# Patient Record
Sex: Female | Born: 1975 | Race: White | Hispanic: No | Smoking: Current every day smoker
Health system: Southern US, Community
[De-identification: ages and names within clinical notes are randomized; demographics above are authoritative.]

## PROBLEM LIST (undated history)

## (undated) DIAGNOSIS — J449 Chronic obstructive pulmonary disease, unspecified: Secondary | ICD-10-CM

## (undated) DIAGNOSIS — J45909 Unspecified asthma, uncomplicated: Secondary | ICD-10-CM

## (undated) DIAGNOSIS — K5792 Diverticulitis of intestine, part unspecified, without perforation or abscess without bleeding: Secondary | ICD-10-CM

## (undated) HISTORY — PX: ABDOMINAL SURGERY: SHX537

## (undated) HISTORY — PX: TUBAL LIGATION: SHX77

---

## 1998-02-14 ENCOUNTER — Emergency Department (HOSPITAL_COMMUNITY): Admission: EM | Admit: 1998-02-14 | Discharge: 1998-02-14 | Payer: Self-pay | Admitting: Emergency Medicine

## 1998-03-01 ENCOUNTER — Encounter: Admission: RE | Admit: 1998-03-01 | Discharge: 1998-03-01 | Payer: Self-pay | Admitting: Family Medicine

## 1998-05-16 ENCOUNTER — Encounter: Payer: Self-pay | Admitting: Emergency Medicine

## 1998-05-16 ENCOUNTER — Emergency Department (HOSPITAL_COMMUNITY): Admission: EM | Admit: 1998-05-16 | Discharge: 1998-05-16 | Payer: Self-pay | Admitting: Emergency Medicine

## 1999-07-11 ENCOUNTER — Emergency Department (HOSPITAL_COMMUNITY): Admission: EM | Admit: 1999-07-11 | Discharge: 1999-07-11 | Payer: Self-pay | Admitting: Emergency Medicine

## 1999-07-13 ENCOUNTER — Emergency Department (HOSPITAL_COMMUNITY): Admission: EM | Admit: 1999-07-13 | Discharge: 1999-07-13 | Payer: Self-pay

## 1999-09-29 ENCOUNTER — Encounter (INDEPENDENT_AMBULATORY_CARE_PROVIDER_SITE_OTHER): Payer: Self-pay | Admitting: *Deleted

## 1999-09-29 LAB — CONVERTED CEMR LAB

## 1999-10-09 ENCOUNTER — Other Ambulatory Visit: Admission: RE | Admit: 1999-10-09 | Discharge: 1999-10-09 | Payer: Self-pay | Admitting: *Deleted

## 1999-10-24 ENCOUNTER — Ambulatory Visit (HOSPITAL_COMMUNITY): Admission: RE | Admit: 1999-10-24 | Discharge: 1999-10-24 | Payer: Self-pay | Admitting: *Deleted

## 1999-11-07 ENCOUNTER — Encounter: Admission: RE | Admit: 1999-11-07 | Discharge: 1999-11-07 | Payer: Self-pay | Admitting: Family Medicine

## 1999-11-11 ENCOUNTER — Encounter: Admission: RE | Admit: 1999-11-11 | Discharge: 1999-11-11 | Payer: Self-pay | Admitting: Family Medicine

## 1999-11-18 ENCOUNTER — Encounter: Payer: Self-pay | Admitting: Emergency Medicine

## 1999-11-18 ENCOUNTER — Emergency Department (HOSPITAL_COMMUNITY): Admission: EM | Admit: 1999-11-18 | Discharge: 1999-11-18 | Payer: Self-pay | Admitting: Emergency Medicine

## 1999-11-26 ENCOUNTER — Emergency Department (HOSPITAL_COMMUNITY): Admission: EM | Admit: 1999-11-26 | Discharge: 1999-11-26 | Payer: Self-pay | Admitting: Emergency Medicine

## 1999-12-14 ENCOUNTER — Encounter: Payer: Self-pay | Admitting: Orthopedic Surgery

## 1999-12-14 ENCOUNTER — Inpatient Hospital Stay (HOSPITAL_COMMUNITY): Admission: EM | Admit: 1999-12-14 | Discharge: 1999-12-15 | Payer: Self-pay | Admitting: Emergency Medicine

## 1999-12-14 ENCOUNTER — Encounter: Payer: Self-pay | Admitting: Emergency Medicine

## 1999-12-21 ENCOUNTER — Encounter: Payer: Self-pay | Admitting: Emergency Medicine

## 1999-12-21 ENCOUNTER — Emergency Department (HOSPITAL_COMMUNITY): Admission: EM | Admit: 1999-12-21 | Discharge: 1999-12-21 | Payer: Self-pay | Admitting: Emergency Medicine

## 2000-01-03 ENCOUNTER — Emergency Department (HOSPITAL_COMMUNITY): Admission: EM | Admit: 2000-01-03 | Discharge: 2000-01-03 | Payer: Self-pay | Admitting: Emergency Medicine

## 2000-01-22 ENCOUNTER — Ambulatory Visit (HOSPITAL_BASED_OUTPATIENT_CLINIC_OR_DEPARTMENT_OTHER): Admission: RE | Admit: 2000-01-22 | Discharge: 2000-01-22 | Payer: Self-pay | Admitting: Orthopedic Surgery

## 2000-01-24 ENCOUNTER — Encounter: Admission: RE | Admit: 2000-01-24 | Discharge: 2000-01-24 | Payer: Self-pay | Admitting: Family Medicine

## 2000-02-10 ENCOUNTER — Encounter: Admission: RE | Admit: 2000-02-10 | Discharge: 2000-02-10 | Payer: Self-pay | Admitting: Family Medicine

## 2000-02-12 ENCOUNTER — Ambulatory Visit (HOSPITAL_COMMUNITY): Admission: RE | Admit: 2000-02-12 | Discharge: 2000-02-12 | Payer: Self-pay | Admitting: Orthopedic Surgery

## 2000-03-03 ENCOUNTER — Ambulatory Visit (HOSPITAL_COMMUNITY): Admission: RE | Admit: 2000-03-03 | Discharge: 2000-03-03 | Payer: Self-pay | Admitting: Orthopedic Surgery

## 2000-03-03 ENCOUNTER — Encounter: Payer: Self-pay | Admitting: Orthopedic Surgery

## 2000-03-23 ENCOUNTER — Encounter: Admission: RE | Admit: 2000-03-23 | Discharge: 2000-06-21 | Payer: Self-pay | Admitting: Orthopedic Surgery

## 2000-11-08 ENCOUNTER — Emergency Department (HOSPITAL_COMMUNITY): Admission: EM | Admit: 2000-11-08 | Discharge: 2000-11-08 | Payer: Self-pay | Admitting: Emergency Medicine

## 2001-01-21 ENCOUNTER — Emergency Department (HOSPITAL_COMMUNITY): Admission: EM | Admit: 2001-01-21 | Discharge: 2001-01-21 | Payer: Self-pay | Admitting: Emergency Medicine

## 2001-04-16 ENCOUNTER — Emergency Department (HOSPITAL_COMMUNITY): Admission: EM | Admit: 2001-04-16 | Discharge: 2001-04-16 | Payer: Self-pay | Admitting: Emergency Medicine

## 2001-05-20 ENCOUNTER — Emergency Department (HOSPITAL_COMMUNITY): Admission: EM | Admit: 2001-05-20 | Discharge: 2001-05-20 | Payer: Self-pay | Admitting: Emergency Medicine

## 2001-09-17 ENCOUNTER — Emergency Department (HOSPITAL_COMMUNITY): Admission: EM | Admit: 2001-09-17 | Discharge: 2001-09-17 | Payer: Self-pay

## 2001-10-22 ENCOUNTER — Emergency Department (HOSPITAL_COMMUNITY): Admission: EM | Admit: 2001-10-22 | Discharge: 2001-10-22 | Payer: Self-pay

## 2001-10-24 ENCOUNTER — Emergency Department (HOSPITAL_COMMUNITY): Admission: EM | Admit: 2001-10-24 | Discharge: 2001-10-25 | Payer: Self-pay | Admitting: *Deleted

## 2001-12-02 ENCOUNTER — Encounter: Admission: RE | Admit: 2001-12-02 | Discharge: 2001-12-02 | Payer: Self-pay | Admitting: Internal Medicine

## 2002-05-14 ENCOUNTER — Emergency Department (HOSPITAL_COMMUNITY): Admission: EM | Admit: 2002-05-14 | Discharge: 2002-05-14 | Payer: Self-pay | Admitting: Emergency Medicine

## 2002-06-15 ENCOUNTER — Emergency Department (HOSPITAL_COMMUNITY): Admission: EM | Admit: 2002-06-15 | Discharge: 2002-06-15 | Payer: Self-pay | Admitting: Emergency Medicine

## 2002-06-15 ENCOUNTER — Encounter: Payer: Self-pay | Admitting: Emergency Medicine

## 2002-09-06 ENCOUNTER — Emergency Department (HOSPITAL_COMMUNITY): Admission: EM | Admit: 2002-09-06 | Discharge: 2002-09-06 | Payer: Self-pay | Admitting: Emergency Medicine

## 2002-09-06 ENCOUNTER — Encounter: Payer: Self-pay | Admitting: Emergency Medicine

## 2002-09-08 ENCOUNTER — Emergency Department (HOSPITAL_COMMUNITY): Admission: EM | Admit: 2002-09-08 | Discharge: 2002-09-09 | Payer: Self-pay | Admitting: Emergency Medicine

## 2002-11-01 ENCOUNTER — Encounter: Admission: RE | Admit: 2002-11-01 | Discharge: 2002-11-01 | Payer: Self-pay | Admitting: Sports Medicine

## 2002-11-09 ENCOUNTER — Emergency Department (HOSPITAL_COMMUNITY): Admission: EM | Admit: 2002-11-09 | Discharge: 2002-11-09 | Payer: Self-pay | Admitting: Emergency Medicine

## 2002-12-09 ENCOUNTER — Encounter: Admission: RE | Admit: 2002-12-09 | Discharge: 2002-12-09 | Payer: Self-pay | Admitting: Family Medicine

## 2002-12-28 ENCOUNTER — Emergency Department (HOSPITAL_COMMUNITY): Admission: EM | Admit: 2002-12-28 | Discharge: 2002-12-28 | Payer: Self-pay | Admitting: Emergency Medicine

## 2002-12-28 ENCOUNTER — Encounter: Payer: Self-pay | Admitting: *Deleted

## 2003-01-16 ENCOUNTER — Encounter: Admission: RE | Admit: 2003-01-16 | Discharge: 2003-01-16 | Payer: Self-pay | Admitting: Family Medicine

## 2003-08-18 ENCOUNTER — Other Ambulatory Visit: Payer: Self-pay

## 2003-08-21 ENCOUNTER — Other Ambulatory Visit: Payer: Self-pay

## 2003-10-26 ENCOUNTER — Emergency Department (HOSPITAL_COMMUNITY): Admission: EM | Admit: 2003-10-26 | Discharge: 2003-10-26 | Payer: Self-pay | Admitting: Family Medicine

## 2003-10-28 ENCOUNTER — Emergency Department (HOSPITAL_COMMUNITY): Admission: EM | Admit: 2003-10-28 | Discharge: 2003-10-28 | Payer: Self-pay | Admitting: Emergency Medicine

## 2003-11-24 ENCOUNTER — Emergency Department (HOSPITAL_COMMUNITY): Admission: EM | Admit: 2003-11-24 | Discharge: 2003-11-24 | Payer: Self-pay | Admitting: Emergency Medicine

## 2004-01-17 ENCOUNTER — Emergency Department (HOSPITAL_COMMUNITY): Admission: EM | Admit: 2004-01-17 | Discharge: 2004-01-17 | Payer: Self-pay | Admitting: Family Medicine

## 2004-01-18 ENCOUNTER — Emergency Department (HOSPITAL_COMMUNITY): Admission: EM | Admit: 2004-01-18 | Discharge: 2004-01-18 | Payer: Self-pay | Admitting: Emergency Medicine

## 2004-03-01 ENCOUNTER — Emergency Department (HOSPITAL_COMMUNITY): Admission: EM | Admit: 2004-03-01 | Discharge: 2004-03-01 | Payer: Self-pay | Admitting: Emergency Medicine

## 2004-04-08 ENCOUNTER — Inpatient Hospital Stay: Payer: Self-pay | Admitting: Unknown Physician Specialty

## 2004-04-25 ENCOUNTER — Other Ambulatory Visit: Payer: Self-pay

## 2004-04-26 ENCOUNTER — Observation Stay: Payer: Self-pay | Admitting: Internal Medicine

## 2004-04-27 ENCOUNTER — Other Ambulatory Visit: Payer: Self-pay

## 2004-06-18 ENCOUNTER — Emergency Department: Payer: Self-pay | Admitting: General Practice

## 2005-10-10 ENCOUNTER — Emergency Department (HOSPITAL_COMMUNITY): Admission: EM | Admit: 2005-10-10 | Discharge: 2005-10-10 | Payer: Self-pay | Admitting: Emergency Medicine

## 2005-12-28 ENCOUNTER — Emergency Department (HOSPITAL_COMMUNITY): Admission: EM | Admit: 2005-12-28 | Discharge: 2005-12-29 | Payer: Self-pay | Admitting: Emergency Medicine

## 2006-07-21 ENCOUNTER — Emergency Department (HOSPITAL_COMMUNITY): Admission: EM | Admit: 2006-07-21 | Discharge: 2006-07-21 | Payer: Self-pay | Admitting: Emergency Medicine

## 2006-08-28 ENCOUNTER — Encounter (INDEPENDENT_AMBULATORY_CARE_PROVIDER_SITE_OTHER): Payer: Self-pay | Admitting: *Deleted

## 2007-01-18 ENCOUNTER — Emergency Department (HOSPITAL_COMMUNITY): Admission: EM | Admit: 2007-01-18 | Discharge: 2007-01-18 | Payer: Self-pay | Admitting: Family Medicine

## 2007-02-17 ENCOUNTER — Emergency Department (HOSPITAL_COMMUNITY): Admission: EM | Admit: 2007-02-17 | Discharge: 2007-02-17 | Payer: Self-pay | Admitting: Emergency Medicine

## 2007-02-23 ENCOUNTER — Emergency Department (HOSPITAL_COMMUNITY): Admission: EM | Admit: 2007-02-23 | Discharge: 2007-02-23 | Payer: Self-pay | Admitting: Emergency Medicine

## 2007-03-09 ENCOUNTER — Emergency Department (HOSPITAL_COMMUNITY): Admission: EM | Admit: 2007-03-09 | Discharge: 2007-03-09 | Payer: Self-pay | Admitting: Emergency Medicine

## 2007-03-25 ENCOUNTER — Emergency Department (HOSPITAL_COMMUNITY): Admission: EM | Admit: 2007-03-25 | Discharge: 2007-03-25 | Payer: Self-pay | Admitting: Emergency Medicine

## 2007-04-05 ENCOUNTER — Emergency Department (HOSPITAL_COMMUNITY): Admission: EM | Admit: 2007-04-05 | Discharge: 2007-04-05 | Payer: Self-pay | Admitting: Emergency Medicine

## 2007-06-16 ENCOUNTER — Inpatient Hospital Stay (HOSPITAL_COMMUNITY): Admission: EM | Admit: 2007-06-16 | Discharge: 2007-06-22 | Payer: Self-pay | Admitting: Emergency Medicine

## 2008-10-15 ENCOUNTER — Emergency Department (HOSPITAL_COMMUNITY): Admission: EM | Admit: 2008-10-15 | Discharge: 2008-10-15 | Payer: Self-pay | Admitting: Emergency Medicine

## 2009-05-11 IMAGING — CT CT ABDOMEN W/O CM
2 of 4 series · 17 of 46 positions shown, 19 images · IV contrast (agent unspecified)
Comparison: 07/21/06.

CLINICAL DATA: 30-year-old female with lower abdominal flank and pelvic pain.  Nausea, dysuria.  History of renal calculi.
ABDOMEN CT WITHOUT CONTRAST:
TECHNIQUE: Multidetector CT imaging of the abdomen was performed following the standard protocol without IV contrast.
TECHNIQUE: Multidetector CT imaging of the pelvis was performed following the standard protocol without IV contrast.

[Series 2: >200 lbs-stone 5.0 b31f · axial · 0.88mm/px · z∈[-578,-198]mm · 14 of 84 slices shown, 16 images]
[im 4/84  soft-tissue]
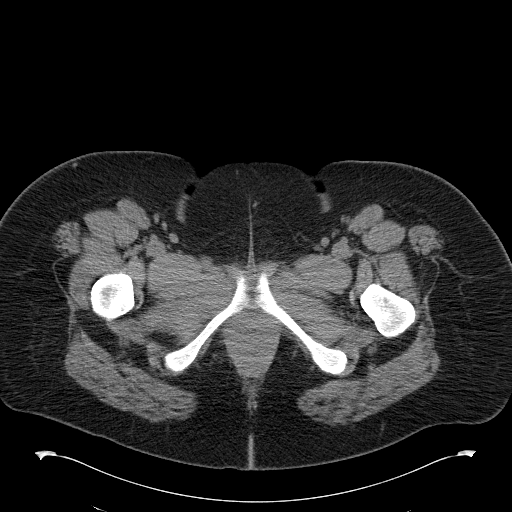
[im 4/84  bone]
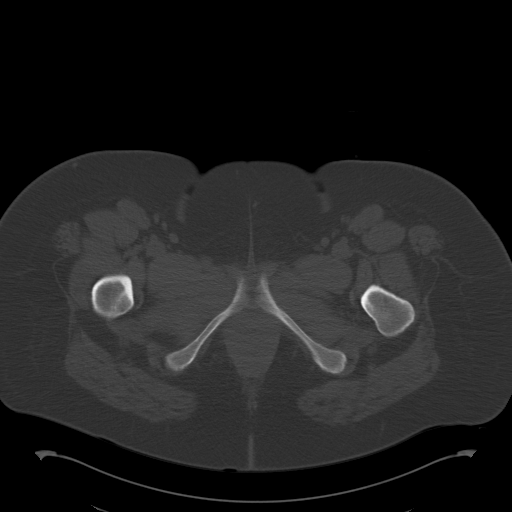
[im 10/84  soft-tissue]
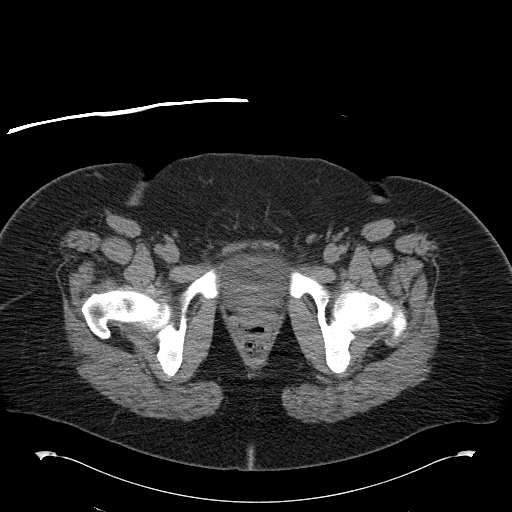
[im 16/84  soft-tissue]
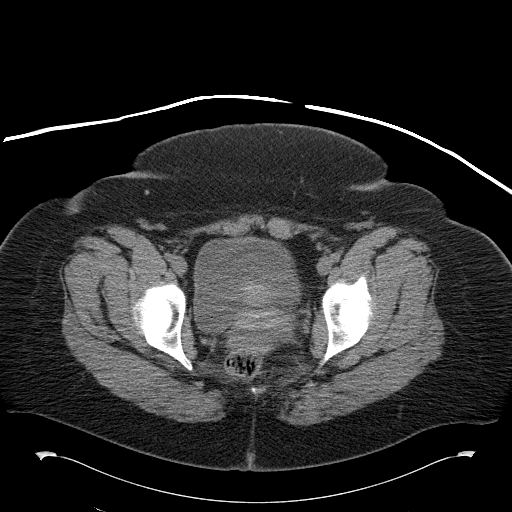
[im 23/84  soft-tissue]
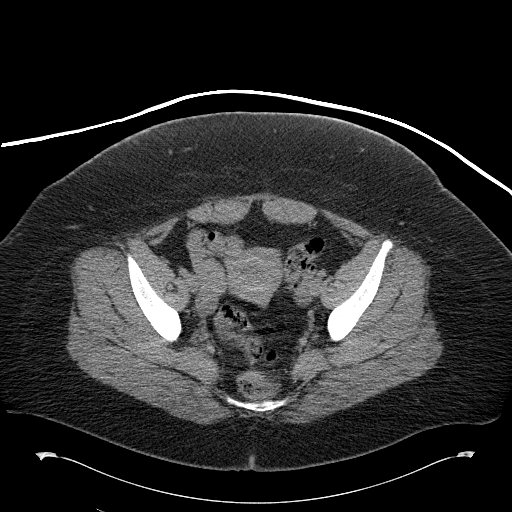
[im 29/84  soft-tissue]
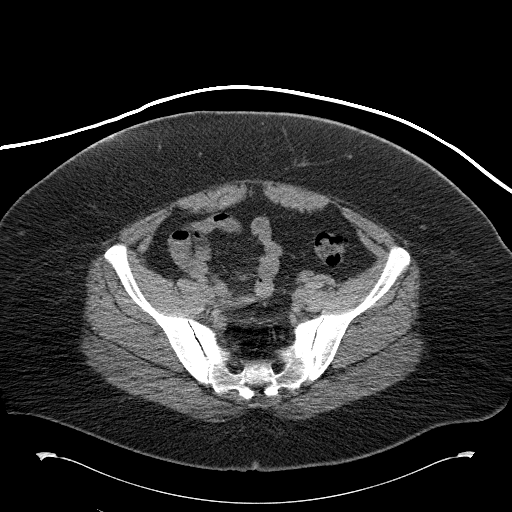
[im 32/84  soft-tissue]
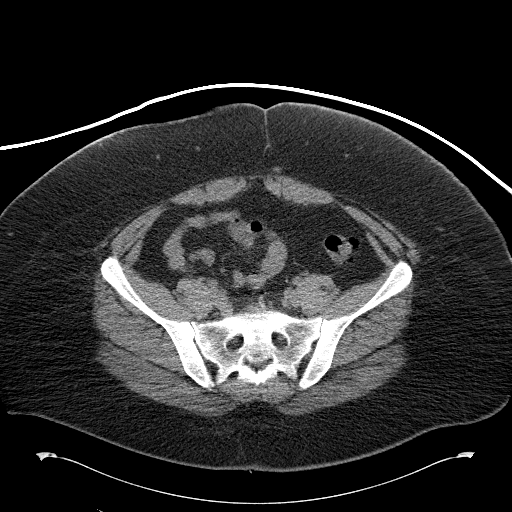
[im 39/84  soft-tissue]
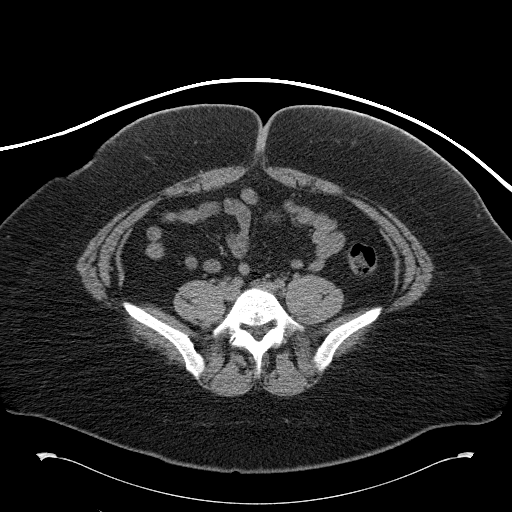
[im 45/84  soft-tissue]
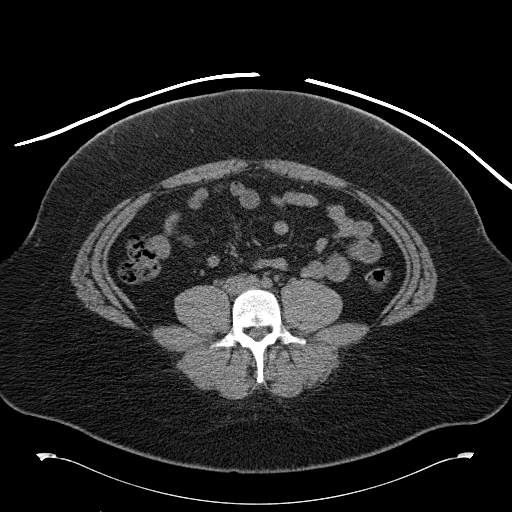
[im 52/84  soft-tissue]
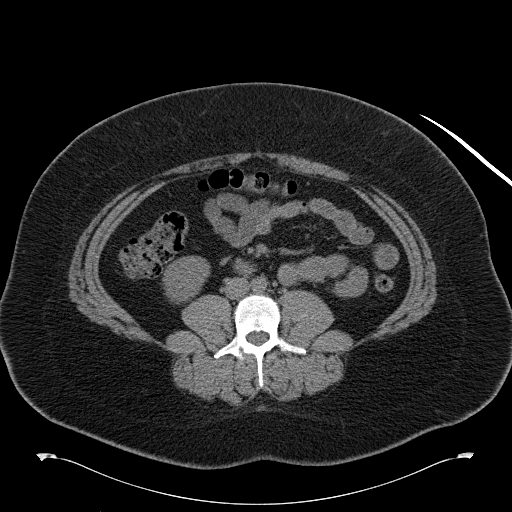
[im 52/84  bone]
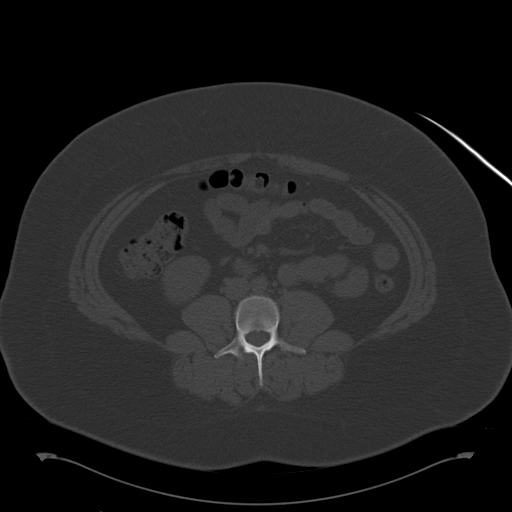
[im 55/84  soft-tissue]
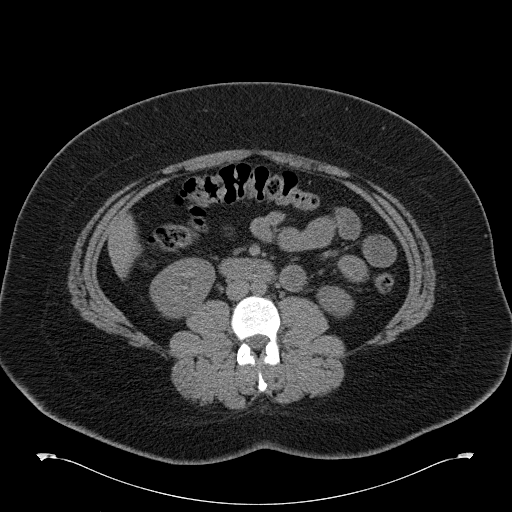
[im 61/84  soft-tissue]
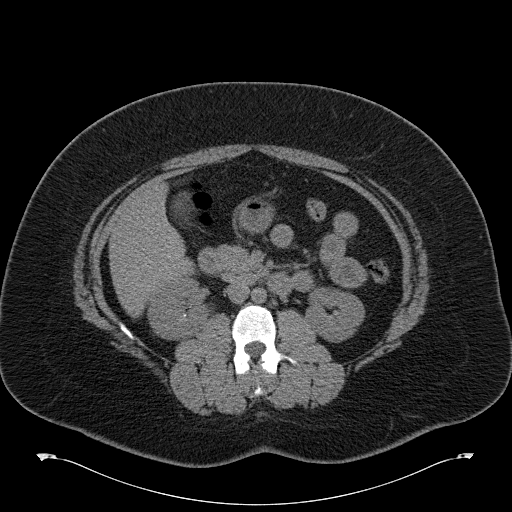
[im 68/84  soft-tissue]
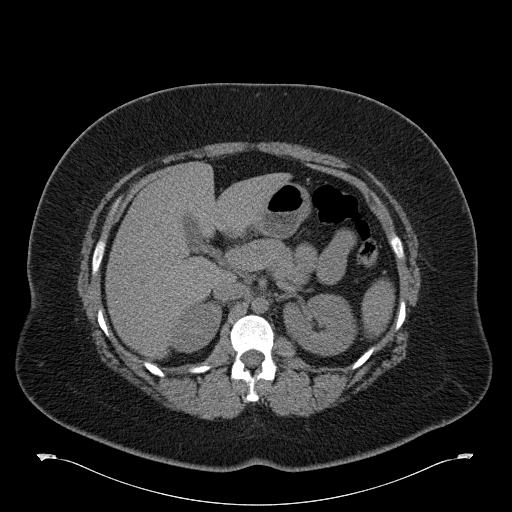
[im 74/84  soft-tissue]
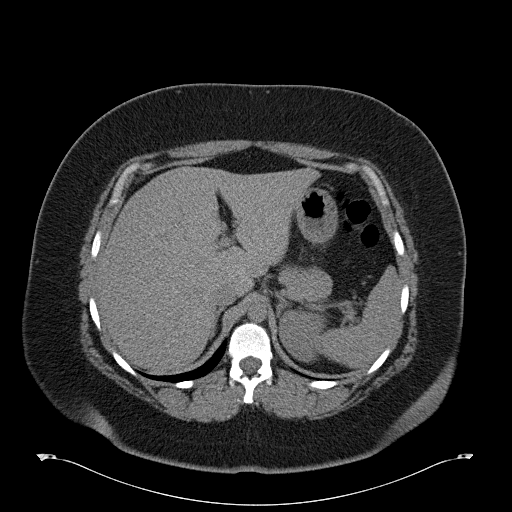
[im 80/84  soft-tissue]
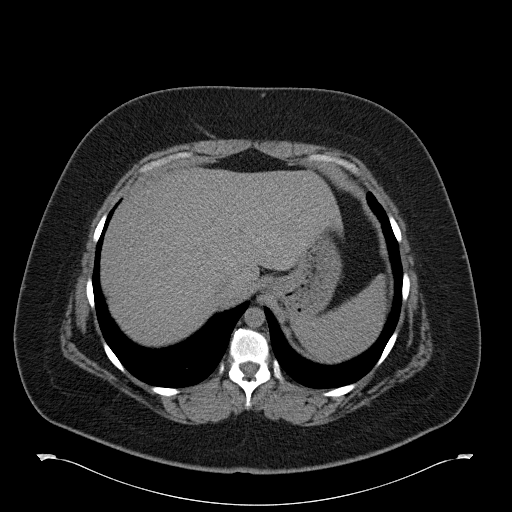

[Series 603: cor a/p · coronal · 0.88mm/px · 3 of 75 slices shown]
[im 25/75  soft-tissue]
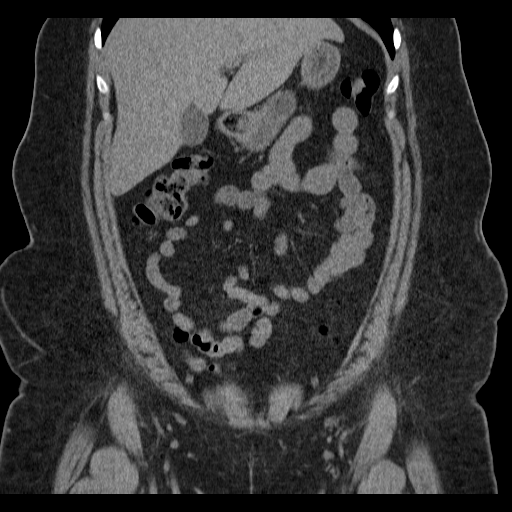
[im 33/75  soft-tissue]
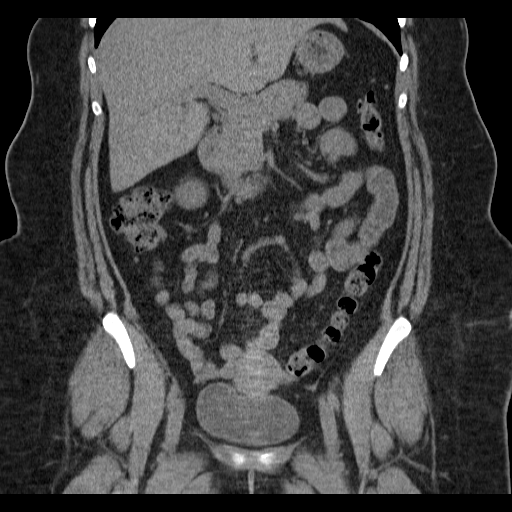
[im 42/75  soft-tissue]
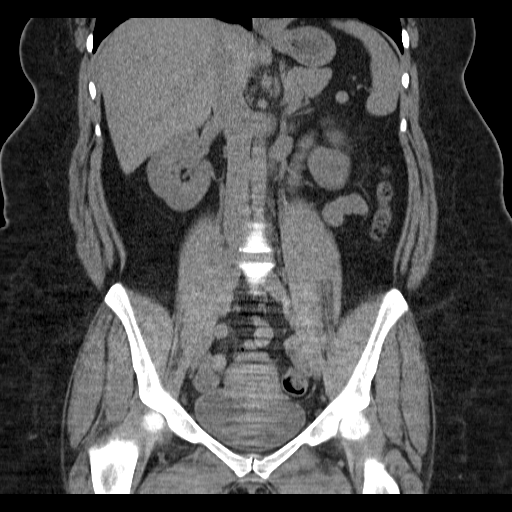

[17 of 46 positions shown; findings below may reference images not displayed]

FINDINGS: Kidneys demonstrate no perinephric inflammation, hydronephrosis or obstructive acute uropathy.  The kidneys are symmetric in size, shape and configuration.  There are nonobstructing punctate renal calculi bilaterally.  Specifically, there is a nonobstructing 3 mm calculus in the mid pole of the left kidney, image 19, and a nonobstructing small calculus in the lower pole of the right kidney on image 24.  The ureters are decompressed without dilatation, inflammation or ureteral calculus.  Accessory splenule is noted in the left upper quadrant.  
Within the limits of noncontrast imaging, there is no additional acute finding in the abdomen.
IMPRESSION: 1. Nonobstructing punctate renal calculi bilaterally.  
2. No acute obstructive urinary tract calculus or hydronephrosis.  
PELVIS CT WITHOUT CONTRAST:
FINDINGS: No distal dilated ureter, pelvic inflammation, ureteral calculus or UVJ abnormality.  Very minimal diverticulosis of the sigmoid colon.  No free fluid, fluid collection, adenopathy or inflammation.  In the right lower quadrant, the terminal ileum and appendix are visualized and normal.
IMPRESSION: 1. No distal ureteral or UVJ calculus.
2. Normal appendix.  
3. No acute pelvic finding.

## 2010-10-09 LAB — URINALYSIS, ROUTINE W REFLEX MICROSCOPIC
Glucose, UA: NEGATIVE mg/dL
Nitrite: NEGATIVE
Protein, ur: NEGATIVE mg/dL
Specific Gravity, Urine: 1.021 (ref 1.005–1.030)
pH: 6.5 (ref 5.0–8.0)

## 2010-10-09 LAB — URINE MICROSCOPIC-ADD ON

## 2010-10-09 LAB — PREGNANCY, URINE: Preg Test, Ur: NEGATIVE
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
## 2010-11-15 NOTE — Op Note (Signed)
[place]. [facility]  Patient:    [name], [name]                       MRN: [id number] Proc. Date: 12/14/99 Adm. Date:  [id number] Disc. Date: [id number] Attending:  Georgena Spurling                           Operative Report  PREOPERATIVE DIAGNOSES:  Right elbow fracture-dislocation, right wrist intra-articular comminuted fracture.  PROCEDURE:  Right closed reduction of the elbow and external fixation with percutaneous pinning of the wrist fracture.  SURGEON:  Georgena Spurling, M.D.  ASSISTANT:  Arnoldo Morale, P.A.  ANESTHESIA:  General.  INDICATION FOR PROCEDURE:  The patient is a 35 year old white female who fell earlier today and came to the emergency room with a chief complaint of right upper extremity pain and was diagnosed radiographically with a dislocated right elbow with a coronoid fracture and an intra-articular distal radius fracture.  After informed consent, she was taken to the operating room.  DESCRIPTION OF PROCEDURE:  The patient was taken to the inpatient operating room and administered general LMA anesthesia.  The right upper extremity was prepped and draped in the usual sterile fashion.  A closed reduction was done on the elbow, and there was felt to be a posterolateral rotatory instability, as supination would allow the radial head to dislocate posteriorly.  It was held in pronation, and the Orthofix fixer was used to fix two distal pins in the second metacarpal shaft after a 2 cm incision was made and blunt dissection was made down to the metacarpal shaft.  C-arm images were done to ensure proper length.  Then a 3 cm incision was made approximately 6 cm proximal to the radiocarpal joint, and the two longer pins were placed through the radial shaft.  The external fixator was then applied, reduction maneuver was obtained, and we then tightened the screws and took AP and lateral images showing good radial length and alignment.  We then  placed a tenaculum clamp on the distal radius, putting the scaphoid and lunate facets together, put one pin from the radial styloid down to the shaft, and one from the radial styloid piece into the lunate facet.  This obtained nearly anatomic reduction with approximately 0.5 mm step-off at the scapholunate facet.  This was acceptable. We then closed the wounds with approximately four 4-0 nylon interrupted sutures.  We then dressed the wounds with Xeroform and 4 x 4s.  We then reduced the elbow, took AP and lateral images to ensure that it was reduced. It was necessary to place the wrist at least in neutral to maintain reduction, so we decided to place it in full pronation and placed a posterior and sugar tong splint and then took two more C-arm images to ensure that it was still anatomically located.  Tourniquet time:  None.  Complications:  None.  EBL: Minimal. DD:  12/14/99 TD:  12/18/99 Job: [id number]

## 2010-11-15 NOTE — Discharge Summary (Signed)
NAMEVANSHIKA, Sutton              ACCOUNT NO.:  [id number]   MEDICAL RECORD NO.:  [id number]          PATIENT TYPE:  INP   LOCATION:  5505                         FACILITY:  [facility]   PHYSICIAN:  Letha Cape, PA    DATE OF BIRTH:  Nov 18, 1975   DATE OF ADMISSION:  06/16/2007  DATE OF DISCHARGE:  06/22/2007                               DISCHARGE SUMMARY   REASON FOR ADMISSION:  [name] is a 35 year old female with no prior  GI history.  She reports abrupt onset of acute abdominal pain 2 days  prior to presentation to the ER over the suprapubic region which she  describes as a crampy pain.  She says the pain is like when she is  getting ready to start her period.  She does have a history of renal  calculi so she thought this might be related to that.  However, the pain  began to increase in quality and severity and no longer was typical for  her usual renal calculi pain.  She also began to have some anorexia,  nausea and vomiting.  At this time she presented to the ER where she was  found to have a significant leukocytosis of 23,000.  A CT of the abdomen  and pelvis was performed that showed diverticulitis of the sigmoid colon  with a contained microperforation without an abscess.  On physical exam,  abdomen revealed to be obese and soft.  No bowel sounds were auscultated  this time.  She is tender around the umbilical and suprapubic areas,  worse on the right than the left.  At this time she does have some  voluntary guarding as well as some rebound.  At this time the patient  was admitted to the hospital for strict bowel rest, IV hydration and  n.p.o. status.  She was also started on empiric Cipro and Flagyl and  given a Dilaudid PCA pump for pain control.   ADMITTING DIAGNOSES:  1. Acute diverticulitis with microperforation, first episode.  2. Leukocytosis.  3. Volume depletion.   HOSPITAL COURSE:  On hospital day #1 the patient was still complaining  of pain in the  umbilical region on the right side greater than the left.  Also with some tenderness still in the suprapubic region as well.  She  is still continuing to have no bowel sounds.  The patient is also at  this time complaining of some itching and insufficient pain control.  Because of the multiple IVs she had for IV fluids as well as for her  PCA, a PICC line was placed at this time as well as her Dilaudid PCA was  changed to a morphine PCA.  She was continued on her n.p.o. status  except for ice chips as well as her antibiotics.  On hospital day #2 the  patient says that her pain is not any better.  She is complaining of  lack of sleep because her oxygen monitor kept going off during the  night.  At this time her white blood cell count has come down and  normalized to 8100 and because of  this, as well as some positive bowel  sounds, she is advanced to a clear liquid diet.  Later in the day the  patient had two bouts of emesis while on the clear liquids and was then  backed off to n.p.o. except for ice chips.  By hospital day #3, the  patient's pain was beginning to get better.  At this time she was  started back on her clear-liquid diet.  Her antibiotics were still  continued at this time and a repeat CT is scheduled for Monday.  Her  repeat CT scan revealed diverticulitis in the sigmoid colon with no  evidence of an abscess.  At this time [name] was changed to oral  narcotics and her PCA was discontinued as well as she was changed to  oral antibiotics and her IV antibiotics were discontinued.  It was also  discussed with her that in approximately 6 weeks when her diverticulitis  was better that she would need to consider seeing a GI doctor in order  to have a colonoscopy done at this time.  By hospital day #6 the  patient's pain was continuing to get better.  At this time she only had  mild tenderness in the periumbilical region.  She was continuing to have  positive bowel sounds and  tolerating a normal diet at this time.  However, on this day she complained of some slight edema of her tongue  and the patient was given a dose of Benadryl.  This dose of Benadryl  helped her tongue edema.  At this time the patient was felt stable  enough to be discharged home.   DISCHARGE DIAGNOSES:  1. Acute diverticulitis with microperforation, no abscess.  2. Tobacco abuse.   DISCHARGE MEDICATIONS:  The patient had no home medications.  However,  she was given a prescription for Percocet 5/325 mg and instructed to  take 0.2 tablets every 4 hours as needed for pain.  She was also given  prescriptions for Cipro 500 mg one tablet twice a day for 14 days as  well as Flagyl 500 mg one tablet three times a day for 14 days.   DISCHARGE INSTRUCTIONS:  The patient is informed that she has no  restrictions on her activity and that she may continue with a low-  residue diet.  She is also informed that is she begins to have any new  or severe pain or fever greater than 101.5 that she will need to call  the office.  She is also told that she is recommended to follow up with  a GI doctor for a colonoscopy in 6 weeks.  At lastly, she is told to  return to Dr. Gerrit Friends in 2 weeks for followup and the office number was  given to her at this time.      Letha Cape, PA     KEO/MEDQ  D:  07/07/2007  T:  07/07/2007  Job:  [id number]   cc:   Velora Heckler, MD
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
## 2010-11-15 NOTE — Op Note (Signed)
[place]. [facility]  Patient:    [name], [name]                     MRN: [id number] Proc. Date: 01/22/00 Adm. Date:  [id number] Disc. Date: [id number] Attending:  Georgena Spurling                           Operative Report  PREOPERATIVE DIAGNOSIS:  Status post right wrist fracture with external fixation, percutaneous pinning.  POSTOPERATIVE DIAGNOSIS:  Status post right wrist fracture with external fixation, percutaneous pinning.  PROCEDURE:  Removal right wrist external fixator and percutaneous pins.  SURGEON:  Dr. Georgena Spurling, M.D.  ASSISTANT:  None.  ANESTHESIA:  General, LMA.  INDICATION FOR PROCEDURE:  The patient is five weeks status post right wrist fracture with placement of external fixator and percutaneous pins.  PROCEDURE IN DETAIL:  After informed consent was obtained, she was taken to the operating room.  The patient was laid supine, administered general anesthesia.  The right upper extremity was prepped in the usual sterile fashion.  We then removed the four pins of the external fixator, the external fixator itself, and the two percutaneous pins.  The sites were then washed with Betadine, dressed with Xeroform and a standard short arm fiberglass catheter was applied.  The elbow was also examined, showing medial laxity and contracture, preventing full supination.  The patient tolerated the procedure well.  COMPLICATIONS:  None.  DRAINS:  None.  TOURNIQUET:  None. DD:  01/22/00 TD:  01/23/00 Job: [id number]

## 2011-04-04 LAB — I-STAT 8, (EC8 V) (CONVERTED LAB)
BUN: 8
Bicarbonate: 20.5
Glucose, Bld: 104 — ABNORMAL HIGH
Sodium: 136
TCO2: 21
pH, Ven: 7.548 — ABNORMAL HIGH

## 2011-04-04 LAB — CBC
HCT: 29.2 — ABNORMAL LOW
HCT: 31.4 — ABNORMAL LOW
HCT: 35.4 — ABNORMAL LOW
HCT: 37.9
HCT: 41
Hemoglobin: 10.7 — ABNORMAL LOW
Hemoglobin: 11.9 — ABNORMAL LOW
Hemoglobin: 12.9
MCHC: 33.5
MCHC: 34.1
MCHC: 34.1
MCV: 90.1
MCV: 90.4
Platelets: 185
Platelets: 256
Platelets: 265
RBC: 3.47 — ABNORMAL LOW
RBC: 3.91
RBC: 4.14
RDW: 14.1
RDW: 14.1
RDW: 14.5
RDW: 15
WBC: 23.1 — ABNORMAL HIGH
WBC: 8.1
WBC: 9.4

## 2011-04-04 LAB — URINE MICROSCOPIC-ADD ON

## 2011-04-04 LAB — BASIC METABOLIC PANEL
BUN: 2 — ABNORMAL LOW
BUN: <1 — ABNORMAL LOW
CO2: 24
Calcium: 8.4
Calcium: 8.6
Chloride: 108
Chloride: 110
Creatinine, Ser: 0.68
Creatinine, Ser: 0.76
GFR calc Af Amer: >60
GFR calc non Af Amer: >60
GFR calc non Af Amer: >60
Glucose, Bld: 496 — ABNORMAL HIGH
Potassium: 3.4 — ABNORMAL LOW
Potassium: 5.6 — ABNORMAL HIGH
Sodium: 138

## 2011-04-04 LAB — POCT I-STAT CREATININE: Operator id: 198171

## 2011-04-04 LAB — COMPREHENSIVE METABOLIC PANEL
ALT: 16
AST: 13
Albumin: 2.6 — ABNORMAL LOW
Alkaline Phosphatase: 51
BUN: <1 — ABNORMAL LOW
CO2: 28
Calcium: 8.3 — ABNORMAL LOW
Chloride: 107
Creatinine, Ser: 0.8
GFR calc Af Amer: >60
GFR calc non Af Amer: >60
Glucose, Bld: 127 — ABNORMAL HIGH
Potassium: 4.1
Sodium: 139
Total Bilirubin: 0.6
Total Protein: 5.3 — ABNORMAL LOW

## 2011-04-04 LAB — DIFFERENTIAL
Basophils Absolute: 0.1
Eosinophils Relative: 0
Eosinophils Relative: 0
Lymphocytes Relative: 5 — ABNORMAL LOW
Lymphocytes Relative: 9 — ABNORMAL LOW
Lymphs Abs: 1
Lymphs Abs: 1.6
Monocytes Absolute: 1.5 — ABNORMAL HIGH
Monocytes Relative: 8
Neutro Abs: 14.4 — ABNORMAL HIGH
Neutro Abs: 20 — ABNORMAL HIGH
Neutrophils Relative %: 86 — ABNORMAL HIGH

## 2011-04-04 LAB — HEPATIC FUNCTION PANEL
ALT: 16
AST: 12
Albumin: 3.5
Alkaline Phosphatase: 65
Total Protein: 6.7

## 2011-04-04 LAB — URINALYSIS, ROUTINE W REFLEX MICROSCOPIC
Bilirubin Urine: NEGATIVE
Glucose, UA: NEGATIVE
Hgb urine dipstick: NEGATIVE
Specific Gravity, Urine: 1.026
Urobilinogen, UA: 1
pH: 8.5 — ABNORMAL HIGH

## 2011-04-04 LAB — HCG, SERUM, QUALITATIVE: Preg, Serum: NEGATIVE

## 2011-04-04 LAB — POCT PREGNANCY, URINE: Preg Test, Ur: NEGATIVE

## 2011-04-10 LAB — URINALYSIS, ROUTINE W REFLEX MICROSCOPIC
Ketones, ur: NEGATIVE
Nitrite: NEGATIVE
Protein, ur: NEGATIVE
Urobilinogen, UA: 0.2

## 2011-04-10 LAB — URINE MICROSCOPIC-ADD ON

## 2011-04-10 LAB — POCT PREGNANCY, URINE
Operator id: 146091
Preg Test, Ur: NEGATIVE

## 2011-04-11 LAB — URINALYSIS, ROUTINE W REFLEX MICROSCOPIC
Bilirubin Urine: NEGATIVE
Glucose, UA: NEGATIVE
Hgb urine dipstick: NEGATIVE
Ketones, ur: NEGATIVE
Nitrite: NEGATIVE
Protein, ur: NEGATIVE
Specific Gravity, Urine: 1.019
Urobilinogen, UA: 0.2
pH: 8

## 2011-04-11 LAB — I-STAT 8, (EC8 V) (CONVERTED LAB)
BUN: 6
Bicarbonate: 27.7 — ABNORMAL HIGH
Glucose, Bld: 75
Hemoglobin: 16 — ABNORMAL HIGH
pCO2, Ven: 52.8 — ABNORMAL HIGH

## 2011-04-11 LAB — POCT URINE HEMOGLOBIN: Hgb urine dipstick: NEGATIVE

## 2011-04-11 LAB — POCT PREGNANCY, URINE
Operator id: 272551
Preg Test, Ur: NEGATIVE

## 2011-04-11 LAB — PREGNANCY, URINE: Preg Test, Ur: NEGATIVE

## 2014-07-16 ENCOUNTER — Emergency Department (HOSPITAL_COMMUNITY)
Admission: EM | Admit: 2014-07-16 | Discharge: 2014-07-16 | Disposition: A | Discharge status: Home / Self Care | Payer: Self-pay | Attending: Emergency Medicine | Admitting: Emergency Medicine

## 2014-07-16 ENCOUNTER — Encounter (HOSPITAL_COMMUNITY): Payer: Self-pay | Admitting: *Deleted

## 2014-07-16 DIAGNOSIS — Z880 Allergy status to penicillin: Secondary | ICD-10-CM | POA: Insufficient documentation

## 2014-07-16 DIAGNOSIS — Z720 Tobacco use: Secondary | ICD-10-CM | POA: Insufficient documentation

## 2014-07-16 DIAGNOSIS — G5602 Carpal tunnel syndrome, left upper limb: Secondary | ICD-10-CM | POA: Insufficient documentation

## 2014-07-16 MED ORDER — HYDROCODONE-ACETAMINOPHEN 5-325 MG PO TABS
2.0000 | ORAL_TABLET | Freq: Once | ORAL | Status: AC
  Administered 2014-07-16: 2 via ORAL
  Filled 2014-07-16: qty 2

## 2014-07-16 MED ORDER — PREDNISONE 20 MG PO TABS: ORAL_TABLET | ORAL | Status: AC

## 2014-07-16 NOTE — ED Notes (Signed)
The pt is c/o lt arm pain and numbness for 2-3 days.  Pain with movement no known injury.  No previous history.  Limited movement als

## 2014-07-16 NOTE — ED Provider Notes (Signed)
CSN: [id number]     Arrival date & time 07/16/14  1854 History   First MD Initiated Contact with Patient 07/16/14 2020     Chief Complaint  Patient presents with  . Arm Pain     (Consider location/radiation/quality/duration/timing/severity/associated sxs/prior Treatment) HPI  [name] is a 39 y.o. female without PMH presenting with left forearm and hand pain as well as sensation of numbness for 2-3 days. Patient has taken Tylenol and ibuprofen without relief. She described as a burning sensation that is worse with repetitive motions and picking up her grandchild. She has no prior history of this. 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 She denies any known injury. She denies any redness, swelling, weakness. Patient denies any chest pain, shortness of breath.   History reviewed. No pertinent past medical history. History reviewed. No pertinent past surgical history. No family history on file. History  Substance Use Topics  . Smoking status: Current Every Day Smoker  . Smokeless tobacco: Not on file  . Alcohol Use: No   OB History    No data available     Review of Systems  Constitutional: Negative for fever and chills.  Respiratory: Negative for cough and shortness of breath.   Cardiovascular: Negative for chest pain and palpitations.  Gastrointestinal: Negative for nausea and vomiting.  Musculoskeletal: Negative for back pain.  Skin: Negative for rash.  Neurological: Positive for numbness. Negative for weakness.      Allergies  Macrobid; Penicillins; and Sulfa antibiotics  Home Medications   Prior to Admission medications   Medication Sig Start Date End Date Taking? Authorizing Provider  predniSONE (DELTASONE) 20 MG tablet 2 tabs po daily x 5 days 07/16/14   TurkeyVictoria L Samora Jernberg, PA-C   BP 108/71 mmHg  Pulse 69  Temp(Src) 97.4 F (36.3 C) (Oral)  Resp 14  Ht 5\' 7"  (1.702 m)  Wt 300 lb (136.079 kg)  BMI 46.98  kg/m2  SpO2 98% Physical Exam  Constitutional: She appears well-developed and well-nourished. No distress.  HENT:  Head: Normocephalic and atraumatic.  Eyes: Conjunctivae and EOM are normal. Right eye exhibits no discharge. Left eye exhibits no discharge.  Cardiovascular: Normal rate, regular rhythm and normal heart sounds.   2+ radial pulses equal bilaterally. Less than 3 second cap refill.  Pulmonary/Chest: Effort normal and breath sounds normal. No respiratory distress. She has no wheezes.  Abdominal: Soft. Bowel sounds are normal. She exhibits no distension. There is no tenderness.  Musculoskeletal:  No midline neck tenderness crepitus or step-off. No left-sided neck tenderness. No clavicular tenderness or step-off. No deformity to left shoulder no redness swelling, warmth. Full range of motion of shoulder. No tenderness.  Neurological: She is alert. She exhibits normal muscle tone. Coordination normal.  5 over 5 upper extremity strength equal bilaterally. Sensation intact. Negative Tinel's sign positive Phalen's on left.  Skin: Skin is warm and dry. She is not diaphoretic.  Nursing note and vitals reviewed.   ED Course  Procedures (including critical care time) Labs Review Labs Reviewed - No data to display  Imaging Review No results found.   EKG Interpretation None      MDM   Final diagnoses:  Carpal tunnel syndrome of left wrist   Patient with burning pain and subjective numbness in distribution of median nerve and left forearm and into hand. Pain aggravated with repetitive movements. No injury. This pain is aggravated with Phalen's sign. Patient without  neurological or vascular deficits on exam. No redness, swelling, warmth or fevers. I doubt septic arthritis. I suspect carpal tunnel syndrome. Patient to follow-up with hand orthopedic surgeon. Patient given wrist brace and wear at night as well as short course of prednisone. Patient denies history of diabetes. Patient is  afebrile, nontoxic, and in no acute distress. Patient is appropriate for outpatient management and is stable for discharge.  Discussed return precautions with patient. Discussed all results and patient verbalizes understanding and agrees with plan.     Louann Sjogren, PA-C 07/16/14 2156  Doug Sou, MD 07/17/14 [phone]

## 2014-07-16 NOTE — Discharge Instructions (Signed)
Return to the emergency room with worsening of symptoms, new symptoms or with symptoms that are concerning, especially swelling, warmth, redness, fevers, worsening numbness tingling or weakness. Call to make appointment as soon as possible with a hand orthopedic surgeon. Wear your wrist brace every night. Reba low information and follow all recommendations.   Carpal Tunnel Syndrome The carpal tunnel is a narrow area located on the palm side of your wrist. The tunnel is formed by the wrist bones and ligaments. Nerves, blood vessels, and tendons pass through the carpal tunnel. Repeated wrist motion or certain diseases may cause swelling within the tunnel. This swelling pinches the main nerve in the wrist (median nerve) and causes the painful hand and arm condition called carpal tunnel syndrome. CAUSES   Repeated wrist motions.  Wrist injuries.  Certain diseases like arthritis, diabetes, alcoholism, hyperthyroidism, and kidney failure.  Obesity.  Pregnancy. SYMPTOMS   A "pins and needles" feeling in your fingers or hand, especially in your thumb, index and middle fingers.  Tingling or numbness in your fingers or hand.  An aching feeling in your entire arm, especially when your wrist and elbow are bent for long periods of time.  Wrist pain that goes up your arm to your shoulder.  Pain that goes down into your palm or fingers.  A weak feeling in your hands. DIAGNOSIS  Your health care provider will take your history and perform a physical exam. An electromyography test may be needed. This test measures electrical signals sent out by your nerves into the muscles. The electrical signals are usually slowed by carpal tunnel syndrome. You may also need X-rays. TREATMENT  Carpal tunnel syndrome may clear up by itself. Your health care provider may recommend a wrist splint or medicine such as a nonsteroidal anti-inflammatory medicine. Cortisone injections may help. Sometimes, surgery may be  needed to free the pinched nerve.  HOME CARE INSTRUCTIONS   Take all medicine as directed by your health care provider. Only take over-the-counter or prescription medicines for pain, discomfort, or fever as directed by your health care provider.  If you were given a splint to keep your wrist from bending, wear it as directed. It is important to wear the splint at night. Wear the splint for as long as you have pain or numbness in your hand, arm, or wrist. This may take 1 to 2 months.  Rest your wrist from any activity that may be causing your pain. If your symptoms are work-related, you may need to talk to your employer about changing to a job that does not require using your wrist.  Put ice on your wrist after long periods of wrist activity.  Put ice in a plastic bag.  Place a towel between your skin and the bag.  Leave the ice on for 15-20 minutes, 03-04 times a day.  Keep all follow-up visits as directed by your health care provider. This includes any orthopedic referrals, physical therapy, and rehabilitation. Any delay in getting necessary care could result in a delay or failure of your condition to heal. SEEK IMMEDIATE MEDICAL CARE IF:   You have new, unexplained symptoms.  Your symptoms get worse and are not helped or controlled with medicines. MAKE SURE YOU:   Understand these instructions.  Will watch your condition.  Will get help right away if you are not doing well or get worse. Document Released: 06/13/2000 Document Revised: 10/31/2013 Document Reviewed: 05/02/2011 [facility] Patient Information 2015 [place], [place]. This information is not intended to replace  advice given to you by your health care provider. Make sure you discuss any questions you have with your health care provider.

## 2014-10-05 ENCOUNTER — Encounter (HOSPITAL_COMMUNITY): Payer: Self-pay

## 2014-10-05 ENCOUNTER — Emergency Department (HOSPITAL_COMMUNITY)
Admission: EM | Admit: 2014-10-05 | Discharge: 2014-10-05 | Disposition: A | Discharge status: Home / Self Care | Payer: Self-pay | Attending: Emergency Medicine | Admitting: Emergency Medicine

## 2014-10-05 ENCOUNTER — Emergency Department (HOSPITAL_COMMUNITY): Payer: Self-pay

## 2014-10-05 DIAGNOSIS — R05 Cough: Secondary | ICD-10-CM

## 2014-10-05 DIAGNOSIS — J069 Acute upper respiratory infection, unspecified: Secondary | ICD-10-CM | POA: Insufficient documentation

## 2014-10-05 DIAGNOSIS — R0789 Other chest pain: Secondary | ICD-10-CM

## 2014-10-05 DIAGNOSIS — Z79899 Other long term (current) drug therapy: Secondary | ICD-10-CM | POA: Insufficient documentation

## 2014-10-05 DIAGNOSIS — Z880 Allergy status to penicillin: Secondary | ICD-10-CM | POA: Insufficient documentation

## 2014-10-05 DIAGNOSIS — J449 Chronic obstructive pulmonary disease, unspecified: Secondary | ICD-10-CM | POA: Insufficient documentation

## 2014-10-05 DIAGNOSIS — R51 Headache: Secondary | ICD-10-CM

## 2014-10-05 DIAGNOSIS — R059 Cough, unspecified: Secondary | ICD-10-CM

## 2014-10-05 DIAGNOSIS — Z720 Tobacco use: Secondary | ICD-10-CM | POA: Insufficient documentation

## 2014-10-05 DIAGNOSIS — R519 Headache, unspecified: Secondary | ICD-10-CM

## 2014-10-05 DIAGNOSIS — Z7951 Long term (current) use of inhaled steroids: Secondary | ICD-10-CM | POA: Insufficient documentation

## 2014-10-05 DIAGNOSIS — Z8719 Personal history of other diseases of the digestive system: Secondary | ICD-10-CM | POA: Insufficient documentation

## 2014-10-05 DIAGNOSIS — Z3202 Encounter for pregnancy test, result negative: Secondary | ICD-10-CM | POA: Insufficient documentation

## 2014-10-05 DIAGNOSIS — R110 Nausea: Secondary | ICD-10-CM | POA: Insufficient documentation

## 2014-10-05 HISTORY — DX: Unspecified asthma, uncomplicated: J45.909

## 2014-10-05 HISTORY — DX: Diverticulitis of intestine, part unspecified, without perforation or abscess without bleeding: K57.92

## 2014-10-05 HISTORY — DX: Chronic obstructive pulmonary disease, unspecified: J44.9

## 2014-10-05 LAB — URINALYSIS, ROUTINE W REFLEX MICROSCOPIC
Bilirubin Urine: NEGATIVE
Glucose, UA: NEGATIVE mg/dL
Ketones, ur: NEGATIVE mg/dL
NITRITE: NEGATIVE
PH: 6.5 (ref 5.0–8.0)
Protein, ur: NEGATIVE mg/dL
SPECIFIC GRAVITY, URINE: 1.023 (ref 1.005–1.030)
UROBILINOGEN UA: 0.2 mg/dL (ref 0.0–1.0)

## 2014-10-05 LAB — CBC WITH DIFFERENTIAL/PLATELET
BASOS ABS: 0 10*3/uL (ref 0.0–0.1)
BASOS PCT: 0 % (ref 0–1)
EOS ABS: 0.2 10*3/uL (ref 0.0–0.7)
Eosinophils Relative: 2 % (ref 0–5)
HCT: 42.2 % (ref 36.0–46.0)
HEMOGLOBIN: 13.9 g/dL (ref 12.0–15.0)
Lymphocytes Relative: 24 % (ref 12–46)
Lymphs Abs: 2.4 10*3/uL (ref 0.7–4.0)
MCH: 29.4 pg (ref 26.0–34.0)
MCHC: 32.9 g/dL (ref 30.0–36.0)
MCV: 89.4 fL (ref 78.0–100.0)
MONO ABS: 0.6 10*3/uL (ref 0.1–1.0)
Monocytes Relative: 6 % (ref 3–12)
NEUTROS ABS: 6.9 10*3/uL (ref 1.7–7.7)
NEUTROS PCT: 68 % (ref 43–77)
Platelets: 267 10*3/uL (ref 150–400)
RBC: 4.72 MIL/uL (ref 3.87–5.11)
RDW: 14.4 % (ref 11.5–15.5)
WBC: 10.1 10*3/uL (ref 4.0–10.5)

## 2014-10-05 LAB — COMPREHENSIVE METABOLIC PANEL
ALT: 18 U/L (ref 0–35)
ANION GAP: 8 (ref 5–15)
AST: 15 U/L (ref 0–37)
Albumin: 3.5 g/dL (ref 3.5–5.2)
Alkaline Phosphatase: 81 U/L (ref 39–117)
BUN: 15 mg/dL (ref 6–23)
CALCIUM: 8.4 mg/dL (ref 8.4–10.5)
CO2: 26 mmol/L (ref 19–32)
Chloride: 106 mmol/L (ref 96–112)
Creatinine, Ser: 0.55 mg/dL (ref 0.50–1.10)
GFR calc non Af Amer: >90 mL/min (ref >90)
GLUCOSE: 132 mg/dL — AB (ref 70–99)
Potassium: 3.8 mmol/L (ref 3.5–5.1)
SODIUM: 140 mmol/L (ref 135–145)
TOTAL PROTEIN: 6.7 g/dL (ref 6.0–8.3)
Total Bilirubin: 0.5 mg/dL (ref 0.3–1.2)

## 2014-10-05 LAB — URINE MICROSCOPIC-ADD ON

## 2014-10-05 LAB — PREGNANCY, URINE: PREG TEST UR: NEGATIVE

## 2014-10-05 LAB — I-STAT TROPONIN, ED: Troponin i, poc: 0 ng/mL (ref 0.00–0.08)

## 2014-10-05 MED ORDER — KETOROLAC TROMETHAMINE 30 MG/ML IJ SOLN
30.0000 mg | Freq: Once | INTRAMUSCULAR | Status: AC
Start: 2014-10-05 — End: 2014-10-05
  Administered 2014-10-05: 30 mg via INTRAVENOUS
  Filled 2014-10-05: qty 1

## 2014-10-05 MED ORDER — BENZONATATE 100 MG PO CAPS: 100.0000 mg | ORAL_CAPSULE | Freq: Three times a day (TID) | ORAL | Status: AC

## 2014-10-05 MED ORDER — GUAIFENESIN 100 MG/5ML PO LIQD: 100.0000 mg | ORAL | Status: AC | PRN

## 2014-10-05 MED ORDER — IBUPROFEN 600 MG PO TABS: 600.0000 mg | ORAL_TABLET | Freq: Four times a day (QID) | ORAL | Status: AC | PRN

## 2014-10-05 MED ORDER — PROCHLORPERAZINE EDISYLATE 5 MG/ML IJ SOLN
10.0000 mg | Freq: Once | INTRAMUSCULAR | Status: AC
  Administered 2014-10-05: 10 mg via INTRAVENOUS
  Filled 2014-10-05: qty 2

## 2014-10-05 MED ORDER — SODIUM CHLORIDE 0.9 % IV BOLUS (SEPSIS)
1000.0000 mL | Freq: Once | INTRAVENOUS | Status: AC
  Administered 2014-10-05: 1000 mL via INTRAVENOUS

## 2014-10-05 MED ORDER — METHOCARBAMOL 500 MG PO TABS: 1000.0000 mg | ORAL_TABLET | Freq: Two times a day (BID) | ORAL | Status: AC

## 2014-10-05 NOTE — ED Notes (Signed)
Patient informed a urine specimen is needed.  

## 2014-10-05 NOTE — ED Notes (Addendum)
Patient complains of headache and chest pain. She has multiple complaints. She reports she had a recent CT scan to evaluate abdominal pain, which showed a hernia and enlarged ovary.  She says, "I feel like there's an elephant on my chest."  She reports a productive cough.  Also states, "I wonder if I have a kidney stone."  Reports bilateral flank pain, but denies urinary problems.

## 2014-10-05 NOTE — ED Notes (Signed)
Patient has multiple complaints. C/o HA, midsternal CP, bilateral flank pain, nausea x2 weeks. Denies urinary symptoms. Also reports V/D three days ago.

## 2014-10-05 NOTE — ED Notes (Signed)
EKG given to EDP,Otter,MD., for review. 

## 2014-10-05 NOTE — ED Provider Notes (Signed)
CSN: [id number]     Arrival date & time 10/05/14  0601 History   First MD Initiated Contact with Patient 10/05/14 0617     Chief Complaint  Patient presents with  . Fatigue     (Consider location/radiation/quality/duration/timing/severity/associated sxs/prior Treatment) HPI Patient is here with multiple complaints. Patient states that over the past week she has been experiencing fatigue and "not feeling well". Patient states she's also been experiencing a mildly productive cough over the past week, a steady, consistent headache, which is similar to previous headaches, only worse than usual and has not been improved with Tylenol. Patient also complains of waking up with centralized chest discomfort this morning. Patient states her chest discomfort is described as a heaviness, and is made worse with coughing and deep inspiration. Patient reports consistent nausea over the past week as well with one episode of vomiting 3 days ago. Patient denies shortness of breath, fever, diarrhea, dysuria. Patient denies previous history of cancer, immobilization, previous history of DVT/PE.   Past Medical History  Diagnosis Date  . Diverticulitis   . Asthma   . COPD (chronic obstructive pulmonary disease)    Past Surgical History  Procedure Laterality Date  . Abdominal surgery    . Tubal ligation     No family history on file. History  Substance Use Topics  . Smoking status: Current Every Day Smoker    Types: Cigarettes  . Smokeless tobacco: Not on file  . Alcohol Use: No   OB History    No data available     Review of Systems  Constitutional: Positive for fatigue. Negative for fever.  HENT: Positive for rhinorrhea. Negative for sore throat and trouble swallowing.   Eyes: Negative for visual disturbance.  Respiratory: Positive for cough and chest tightness. Negative for shortness of breath.   Cardiovascular: Negative for chest pain, palpitations and leg swelling.  Gastrointestinal: Positive  for nausea. Negative for vomiting and abdominal pain.  Genitourinary: Negative for dysuria.  Musculoskeletal: Negative for neck pain and neck stiffness.  Skin: Negative for rash.  Neurological: Positive for headaches. Negative for dizziness, weakness, light-headedness and numbness.  Psychiatric/Behavioral: Negative.       Allergies  Macrobid; Penicillins; Sulfa antibiotics; and Vistaril  Home Medications   Prior to Admission medications   Medication Sig Start Date End Date Taking? Authorizing Provider  albuterol (PROVENTIL HFA;VENTOLIN HFA) 108 (90 BASE) MCG/ACT inhaler Inhale 1-2 puffs into the lungs every 6 (six) hours as needed for wheezing or shortness of breath.   Yes Historical Provider, MD  Fluticasone-Salmeterol (ADVAIR) 250-50 MCG/DOSE AEPB Inhale 1 puff into the lungs 2 (two) times daily.   Yes Historical Provider, MD  naproxen sodium (ANAPROX) 220 MG tablet Take 440 mg by mouth 2 (two) times daily with a meal.   Yes Historical Provider, MD  tiotropium (SPIRIVA) 18 MCG inhalation capsule Place 18 mcg into inhaler and inhale daily.   Yes Historical Provider, MD  benzonatate (TESSALON) 100 MG capsule Take 1 capsule (100 mg total) by mouth every 8 (eight) hours. 10/05/14   Ladona Mow, PA-C  guaiFENesin (ROBITUSSIN) 100 MG/5ML liquid Take 5-10 mLs (100-200 mg total) by mouth every 4 (four) hours as needed for cough. 10/05/14   Ladona Mow, PA-C  ibuprofen (ADVIL,MOTRIN) 600 MG tablet Take 1 tablet (600 mg total) by mouth every 6 (six) hours as needed. 10/05/14   Ladona Mow, PA-C  methocarbamol (ROBAXIN) 500 MG tablet Take 2 tablets (1,000 mg total) by mouth 2 (two) times daily. 10/05/14  Ladona MowJoe Holley Kocurek, PA-C  predniSONE (DELTASONE) 20 MG tablet 2 tabs po daily x 5 days Patient not taking: Reported on 10/05/2014 07/16/14   Oswaldo ConroyVictoria Creech, PA-C   BP 136/68 mmHg  Pulse 73  Temp(Src) 97.8 F (36.6 C) (Oral)  Resp 20  SpO2 96%  LMP 09/25/2014 (Approximate) Physical Exam  Constitutional: She is  oriented to person, place, and time. She appears well-developed and well-nourished. No distress.  HENT:  Head: Normocephalic and atraumatic.  Mouth/Throat: Oropharynx is clear and moist. No oropharyngeal exudate.  Eyes: Right eye exhibits no discharge. Left eye exhibits no discharge. No scleral icterus.  Neck: Normal range of motion.  Cardiovascular: Normal rate, regular rhythm, S1 normal, S2 normal, normal heart sounds and normal pulses.   No murmur heard. Pulmonary/Chest: Effort normal and breath sounds normal. No accessory muscle usage. No tachypnea. No respiratory distress.  Abdominal: Soft. Normal appearance and bowel sounds are normal. There is no tenderness. There is no rigidity, no guarding, no tenderness at McBurney's point and negative Murphy's sign.  Musculoskeletal: Normal range of motion. She exhibits no edema or tenderness.  Neurological: She is alert and oriented to person, place, and time. No cranial nerve deficit. Coordination normal. GCS eye subscore is 4. GCS verbal subscore is 5. GCS motor subscore is 6.  Skin: Skin is warm and dry. No rash noted. She is not diaphoretic.  Psychiatric: She has a normal mood and affect.  Nursing note and vitals reviewed.   ED Course  Procedures (including critical care time) Labs Review Labs Reviewed  COMPREHENSIVE METABOLIC PANEL - Abnormal; Notable for the following:    Glucose, Bld 132 (*)    All other components within normal limits  URINALYSIS, ROUTINE W REFLEX MICROSCOPIC - Abnormal; Notable for the following:    APPearance CLOUDY (*)    Hgb urine dipstick TRACE (*)    Leukocytes, UA SMALL (*)    All other components within normal limits  URINE MICROSCOPIC-ADD ON - Abnormal; Notable for the following:    Squamous Epithelial / LPF FEW (*)    Bacteria, UA FEW (*)    All other components within normal limits  URINE CULTURE  CBC WITH DIFFERENTIAL/PLATELET  PREGNANCY, URINE  I-STAT TROPOININ, ED  Rosezena SensorI-STAT TROPOININ, ED     Imaging Review Dg Chest 2 View  10/05/2014   CLINICAL DATA:  Cough. Congestion. Short of breath. Mid chest band. Worsening symptoms over the last week. Cigarette smoker.  EXAM: CHEST  2 VIEW  COMPARISON:  06/17/2007.  FINDINGS: Cardiopericardial silhouette within normal limits. Mediastinal contours normal. Trachea midline. No airspace disease or effusion. Monitoring leads project over the chest.  IMPRESSION: No active cardiopulmonary disease.   Electronically Signed   By: Andreas NewportGeoffrey  Lamke M.D.   On: 10/05/2014 07:04     EKG Interpretation   Date/Time:  Thursday October 05 2014 06:16:48 EDT Ventricular Rate:  69 PR Interval:  158 QRS Duration: 107 QT Interval:  422 QTC Calculation: 452 R Axis:   37 Text Interpretation:  Sinus rhythm Low voltage, precordial leads Probable  anteroseptal infarct, old Baseline wander in lead(s) V4 Confirmed by OTTER   MD, OLGA ([id number]) on 10/05/2014 6:19:50 AM      MDM   Final diagnoses:  Cough  Upper respiratory infection  Chest wall pain  Bad headache    1. Headache Pt HA treated and improved while in ED.  Presentation is like pts typical HA and non concerning for [facility], ICH, Meningitis, or temporal arteritis. Pt is afebrile  with no focal neuro deficits, nuchal rigidity, or change in vision. Pt is to follow up with PCP to discuss prophylactic medication. Pt verbalizes understanding and is agreeable with plan to dc.   2. Chest discomfort Chest discomfort is pleuritic in nature, patient has not been experiencing any discomfort while in the ED. Patient states she fell asleep for a while, woke up, and coughed and the chest pain returned with her cough. Troponin negative, EKD without evidence of acute injury or ectopy.  Offered delta troponin, however patient declined. Do not suspect ACS. Patient very low risk for having a PE, not suspicious for PE at this time. Based on pleuritic nature, and mild symptoms likely patient is experiencing some chest wall pain  associated with her cough.  Patient hemodynamically stable throughout ED stay. Patient well-appearing and in no acute distress on reexamination. Patient to be discharged with symptomatically therapy for upper respiratory infection, strongly encouraged to follow-up with a primary care provider. Discussed return precautions with patient, encouraged patient to call or return to the ER if any worsening of symptoms or should she have any questions or concerns.  BP 136/68 mmHg  Pulse 73  Temp(Src) 97.8 F (36.6 C) (Oral)  Resp 20  SpO2 96%  LMP 09/25/2014 (Approximate)  Signed,  Ladona Mow, PA-C 10:25 AM  Patient discussed with Dr. Marisa Severin, MD    Ladona Mow, PA-C 10/05/14 1026  Marisa Severin, MD 10/06/14 [phone]

## 2014-10-05 NOTE — ED Notes (Signed)
Pt refusing i-stat redraw

## 2014-10-05 NOTE — Discharge Instructions (Signed)
Upper Respiratory Infection, Adult °An upper respiratory infection (URI) is also sometimes known as the common cold. The upper respiratory tract includes the nose, sinuses, throat, trachea, and bronchi. Bronchi are the airways leading to the lungs. Most people improve within 1 week, but symptoms can last up to 2 weeks. A residual cough may last even longer.  °CAUSES °Many different viruses can infect the tissues lining the upper respiratory tract. The tissues become irritated and inflamed and often become very moist. Mucus production is also common. A cold is contagious. You can easily spread the virus to others by oral contact. This includes kissing, sharing a glass, coughing, or sneezing. Touching your mouth or nose and then touching a surface, which is then touched by another person, can also spread the virus. °SYMPTOMS  °Symptoms typically develop 1 to 3 days after you come in contact with a cold virus. Symptoms vary from person to person. They may include: °· Runny nose. °· Sneezing. °· Nasal congestion. °· Sinus irritation. °· Sore throat. °· Loss of voice (laryngitis). °· Cough. °· Fatigue. °· Muscle aches. °· Loss of appetite. °· Headache. °· Low-grade fever. °DIAGNOSIS  °You might diagnose your own cold based on familiar symptoms, since most people get a cold 2 to 3 times a year. Your caregiver can confirm this based on your exam. Most importantly, your caregiver can check that your symptoms are not due to another disease such as strep throat, sinusitis, pneumonia, asthma, or epiglottitis. Blood tests, throat tests, and X-rays are not necessary to diagnose a common cold, but they may sometimes be helpful in excluding other more serious diseases. Your caregiver will decide if any further tests are required. °RISKS AND COMPLICATIONS  °You may be at risk for a more severe case of the common cold if you smoke cigarettes, have chronic heart disease (such as heart failure) or lung disease (such as asthma), or if  you have a weakened immune system. The very young and very old are also at risk for more serious infections. Bacterial sinusitis, middle ear infections, and bacterial pneumonia can complicate the common cold. The common cold can worsen asthma and chronic obstructive pulmonary disease (COPD). Sometimes, these complications can require emergency medical care and may be life-threatening. °PREVENTION  °The best way to protect against getting a cold is to practice good hygiene. Avoid oral or hand contact with people with cold symptoms. Wash your hands often if contact occurs. There is no clear evidence that vitamin C, vitamin E, echinacea, or exercise reduces the chance of developing a cold. However, it is always recommended to get plenty of rest and practice good nutrition. °TREATMENT  °Treatment is directed at relieving symptoms. There is no cure. Antibiotics are not effective, because the infection is caused by a virus, not by bacteria. Treatment may include: °· Increased fluid intake. Sports drinks offer valuable electrolytes, sugars, and fluids. °· Breathing heated mist or steam (vaporizer or shower). °· Eating chicken soup or other clear broths, and maintaining good nutrition. °· Getting plenty of rest. °· Using gargles or lozenges for comfort. °· Controlling fevers with ibuprofen or acetaminophen as directed by your caregiver. °· Increasing usage of your inhaler if you have asthma. °Zinc gel and zinc lozenges, taken in the first 24 hours of the common cold, can shorten the duration and lessen the severity of symptoms. Pain medicines may help with fever, muscle aches, and throat pain. A variety of non-prescription medicines are available to treat congestion and runny nose. Your caregiver   can make recommendations and may suggest nasal or lung inhalers for other symptoms.  HOME CARE INSTRUCTIONS   Only take over-the-counter or prescription medicines for pain, discomfort, or fever as directed by your  caregiver.  Use a warm mist humidifier or inhale steam from a shower to increase air moisture. This may keep secretions moist and make it easier to breathe.  Drink enough water and fluids to keep your urine clear or pale yellow.  Rest as needed.  Return to work when your temperature has returned to normal or as your caregiver advises. You may need to stay home longer to avoid infecting others. You can also use a face mask and careful hand washing to prevent spread of the virus. SEEK MEDICAL CARE IF:   After the first few days, you feel you are getting worse rather than better.  You need your caregiver's advice about medicines to control symptoms.  You develop chills, worsening shortness of breath, or brown or red sputum. These may be signs of pneumonia.  You develop yellow or brown nasal discharge or pain in the face, especially when you bend forward. These may be signs of sinusitis.  You develop a fever, swollen neck glands, pain with swallowing, or white areas in the back of your throat. These may be signs of strep throat. SEEK IMMEDIATE MEDICAL CARE IF:   You have a fever.  You develop severe or persistent headache, ear pain, sinus pain, or chest pain.  You develop wheezing, a prolonged cough, cough up blood, or have a change in your usual mucus (if you have chronic lung disease).  You develop sore muscles or a stiff neck. Document Released: 12/10/2000 Document Revised: 09/08/2011 Document Reviewed: 09/21/2013 [facility] Patient Information 2015 [place], [place]. This information is not intended to replace advice given to you by your health care provider. Make sure you discuss any questions you have with your health care provider.  Chest Wall Pain Chest wall pain is pain in or around the bones and muscles of your chest. It may take up to 6 weeks to get better. It may take longer if you must stay physically active in your work and activities.  CAUSES  Chest wall pain may happen on  its own. However, it may be caused by:  A viral illness like the flu.  Injury.  Coughing.  Exercise.  Arthritis.  Fibromyalgia.  Shingles. HOME CARE INSTRUCTIONS   Avoid overtiring physical activity. Try not to strain or perform activities that cause pain. This includes any activities using your chest or your abdominal and side muscles, especially if heavy weights are used.  Put ice on the sore area.  Put ice in a plastic bag.  Place a towel between your skin and the bag.  Leave the ice on for 15-20 minutes per hour while awake for the first 2 days.  Only take over-the-counter or prescription medicines for pain, discomfort, or fever as directed by your caregiver. SEEK IMMEDIATE MEDICAL CARE IF:   Your pain increases, or you are very uncomfortable.  You have a fever.  Your chest pain becomes worse.  You have new, unexplained symptoms.  You have nausea or vomiting.  You feel sweaty or lightheaded.  You have a cough with phlegm (sputum), or you cough up blood. MAKE SURE YOU:   Understand these instructions.  Will watch your condition.  Will get help right away if you are not doing well or get worse. Document Released: 06/16/2005 Document Revised: 09/08/2011 Document Reviewed: 02/10/2011 ExitCare  Patient Information 2015 [place], [place]. This information is not intended to replace advice given to you by your health care provider. Make sure you discuss any questions you have with your health care provider.  Cool Mist Vaporizers Vaporizers may help relieve the symptoms of a cough and cold. They add moisture to the air, which helps mucus to become thinner and less sticky. This makes it easier to breathe and cough up secretions. Cool mist vaporizers do not cause serious burns like hot mist vaporizers, which may also be called steamers or humidifiers. Vaporizers have not been proven to help with colds. You should not use a vaporizer if you are allergic to mold. HOME CARE  INSTRUCTIONS  Follow the package instructions for the vaporizer.  Do not use anything other than distilled water in the vaporizer.  Do not run the vaporizer all of the time. This can cause mold or bacteria to grow in the vaporizer.  Clean the vaporizer after each time it is used.  Clean and dry the vaporizer well before storing it.  Stop using the vaporizer if worsening respiratory symptoms develop. Document Released: 03/13/2004 Document Revised: 06/21/2013 Document Reviewed: 11/03/2012 [facility] Patient Information 2015 [place], [place]. This information is not intended to replace advice given to you by your health care provider. Make sure you discuss any questions you have with your health care provider.   Emergency Department Resource Guide 1) Find a Doctor and Pay Out of Pocket Although you won't have to find out who is covered by your insurance plan, it is a good idea to ask around and get recommendations. You will then need to call the office and see if the doctor you have chosen will accept you as a new patient and what types of options they offer for patients who are self-pay. Some doctors offer discounts or will set up payment plans for their patients who do not have insurance, but you will need to ask so you aren't surprised when you get to your appointment.  2) Contact Your Local Health Department Not all health departments have doctors that can see patients for sick visits, but many do, so it is worth a call to see if yours does. If you don't know where your local health department is, you can check in your phone book. The CDC also has a tool to help you locate your state's health department, and many state websites also have listings of all of their local health departments.  3) Find a Walk-in Clinic If your illness is not likely to be very severe or complicated, you may want to try a walk in clinic. These are popping up all over the country in pharmacies, drugstores, and shopping  centers. They're usually staffed by nurse practitioners or physician assistants that have been trained to treat common illnesses and complaints. They're usually fairly quick and inexpensive. However, if you have serious medical issues or chronic medical problems, these are probably not your best option.  No Primary Care Doctor: - Call Health Connect at  [phone] - they can help you locate a primary care doctor that  accepts your insurance, provides certain services, etc. - Physician Referral Service- [phone]  Chronic Pain Problems: Organization         Address  Phone   Notes  [facility]  [phone] Patients need to be referred by their primary care doctor.   Medication Assistance: Organization         Address  Phone   Notes  [facility] [address] [place], [place] [address] [phone] --Must be a resident of [facility] -- Must have NO insurance coverage whatsoever (no Medicaid/ Medicare, etc.) -- The pt. MUST have a primary care doctor that directs their care regularly and follows them in the community   MedAssist  [phone]   [employer]  [phone]    Agencies that provide inexpensive medical care: Organization         Address  Phone   Notes  [facility]  [phone]   [facility]    [phone]   [facility] [address] [place], [place] [address] [phone]   [facility] [address], [place] [phone]   [facility]anned Parenthood    [phone]   [facility]    [phone]   [facility]  [address], [place] Phone:  [phone], Fax:  [phone] Hours of Operation:  9 am - 6 pm, M-F.  Also accepts Medicaid/Medicare and self-pay.  [facility]  [address], [place] Phone: [phone], Fax: [phone])  [phone]. Hours of Operation:  8:30 am - 5:30 pm, M-F.  Also accepts Medicaid and self-pay.  [facility] [place] [address], [place] Phone: [phone]) [phone]   [facility] [address], [place], [place] [phone], Ext. [phone] Mondays & Thursdays: 7-9 AM.  First 15 patients are seen on a first come, first serve basis.    Medicaid-accepting [facility] Providers:  Organization         Address  Phone   Notes  [facility] [address], [place] [phone] Also accepts self-pay patients.  [facility] [address] [place], [place]  [phone]) [phone]   [facility] [address], [place] [phone]) [phone]   [facility] [address] [place] [address], [place] [phone](336) [phone]   Renaye RakersVeita Bland [address], [place]   [phone] Only accepts [place] Access [place] patients after they have their name applied to their card.   Self-Pay (no insurance) in [facility]:  Organization         Address  Phone   Notes  Sickle Cell Patients, [facility] [address], [place] [phone]) [phone]   [facility] [address], [place] [phone]) [phone]   [facility]  [address], [place] [phone]   [facility]/Dr. Osei-Bonsu  [address] [place] [address], [place] or [phone], [place] [phone]) [phone] Phone number for both [place] and [place] locations is the same.  [facility] [address], [place] [phone]   [facility]  [address] [place] [address], [place] or [address] [phone]   [facility] [address], [place] [phone], phone; [phone]) [phone], fax Sees patients 1st and 3rd Saturday of every month.  Must not qualify for public or private insurance (i.e.  Medicaid, Medicare, [place] Health Choice, Veterans' Benefits)  Household income should be no more than 200% of the poverty level The clinic cannot treat you if you are pregnant or think you are pregnant  Sexually transmitted diseases are  not treated at the clinic.    Dental Care: Organization         Address  Phone  Notes  [facility] [address] [place], [place] [phone] Accepts children up to age 24 who are enrolled in [place] or [place] Health Choice; pregnant women with a Medicaid card; and children who have applied for Medicaid or [place] Health Choice, but were declined, whose parents can pay a reduced fee at time of service.  [facility]  [address], [place] [phone] Accepts children up to age 21 who are enrolled in [place] or [place] Health Choice; pregnant women with a Medicaid card; and children who have applied for Medicaid or [place] Health Choice, but were declined, whose parents can pay a reduced fee at time of service.  [facility]  [address] [employer], [place] [phone] Patients are seen by appointment only. Walk-ins are not accepted. [facility] will see patients 48 years of age and older. Monday - Tuesday (8am-5pm) Most Wednesdays (8:30-5pm) $30 per visit, cash only  [facility]  [address], [facility] [phone] Patients are seen by appointment only. Walk-ins are not accepted. [facility] will see patients 55 years of age and older. One Wednesday Evening (Monthly: Volunteer Based).  $30 per visit, cash only  [employer]  [phone] for adults; Children under age 48, call Graduate Pediatric Dentistry at [phone]. Children aged 67-14, please call [phone] to request a pediatric application.  Dental services are provided in all areas of dental care including fillings,  crowns and bridges, complete and partial dentures, implants, gum treatment, root canals, and extractions. Preventive care is also provided. Treatment is provided to both adults and children. Patients are selected via a lottery and there is often a waiting list.   [facility] [address] [place]  [phone] [email or web address]   [facility] [address] [place], [place] [phone], Ext. [phone] Second and Fourth Thursday of each month, opens at 6:30 AM; Clinic ends at 9 AM.  Patients are seen on a first-come first-served basis, and a limited number are seen during each clinic.   [facility]  [address] [place] [place], [place] [phone]   Eligibility Requirements You must have lived in [place], [place], or [place] counties for at least the last three months.   You cannot be eligible for state or federal sponsored [place], including [employer], [place], or [employer].   You generally cannot be eligible for healthcare insurance through your employer.    How to apply: Eligibility screenings are held every Tuesday and Wednesday afternoon from 1:00 pm until 4:00 pm. You do not need an appointment for the interview!  [facility] [address] [place], [place] [phone]   [facility] Health Department  [phone]   [facility] Health [facility]  [phone]   [facility] Health Department  [phone]    Behavioral Health Resources in the Community: Intensive Outpatient Programs Organization         Address  Phone  Notes  [facility] Services [address], [place], [place] [phone]   [facility] [address] [place], [place] [phone]   [facility] [address], [place], [place]  [phone]   [facility] [address]  [place], [place] [phone] or [phone]   Substance Abuse  Resources Organization         Address  Phone  Notes  [facility] Drug Services  [phone]   [facility]  [phone]   [facility]  [phone]   Floydene Flock  [phone]   Residential & Outpatient Substance Abuse Program  [phone]   Psychological Services Organization         Address  Phone  Notes  [facility]  [phone]   [facility] Services  [phone]   [facility] [address] [place] [phone] or [phone]    Mobile Crisis Teams Organization         Address  Phone  Notes  [facility], Mobile Crisis Care Unit  [phone]   [facility]  [address] [place], [place] [phone]   Doristine Locks [address] [place] [phone]    Self-Help/Support Groups Organization         Address  Phone             Notes  [facility] [place] - variety of support groups  [phone]- [phone] Call for more information  Narcotics Anonymous (NA), [facility] [address], [employer] [place]  2 meetings at this location   Statistician         Address  Phone  Notes  [facility] [address],    [place]  [phone]   [facility]  [address], [place] [address], [place], [place] [phone]   [facility] [address] [place], [place] [phone] Admissions: 8am-3pm M-F  [facility] [address]    [place], [place] [phone]   [facility] [address] [place], [place], [place] [phone]   [facility] [address],  [place], [place] [phone]   [facility] - Intensive Outpatient [address], [place], [place] [phone]   [facility] [address] [place]  [place], [place] [phone] or [phone]   [facility] ([facility]) [address], [place], [place] [phone] Accepts Medicaid  [facility] [address],  [place] [phone] Substance Abuse/Addiction Treatment   [facility] Organization         Address  Phone  Notes  [facility]  [phone]   Angie Fava, PhD [address] [place], [place]   [phone] or [phone]   [facility]   [address] [place], [place] [phone]   [facility] [address], [place], [place] [phone] Insurance/Medicaid/sponsorship through [facility] and Families [address]                                    [place], [place] [phone] Therapy/tele-psych/case  [facility] [address] [place], [place] [phone]    Dr. Lolly Mustache  [phone]   [facility]  [employer] [facility] Dept. 1) [address], [place] 2) [address], [place] 3)  [address], [place] [phone]  [phone]   [facility] Child Abuse Hotline 726-266-9686 or [phone] (After Hours)

## 2014-10-06 LAB — URINE CULTURE

## 2023-01-14 LAB — EXTERNAL GENERIC LAB PROCEDURE: COLOGUARD: NEGATIVE
